# Patient Record
Sex: Female | Born: 1995 | Race: Black or African American | Hispanic: No | Marital: Married | Smoking: Current every day smoker
Health system: Southern US, Community
[De-identification: ages and names within clinical notes are randomized; demographics above are authoritative.]

## PROBLEM LIST (undated history)

## (undated) DIAGNOSIS — I864 Gastric varices: Secondary | ICD-10-CM

## (undated) DIAGNOSIS — R519 Headache, unspecified: Secondary | ICD-10-CM

## (undated) DIAGNOSIS — I10 Essential (primary) hypertension: Secondary | ICD-10-CM

## (undated) DIAGNOSIS — F32A Depression, unspecified: Secondary | ICD-10-CM

## (undated) DIAGNOSIS — F329 Major depressive disorder, single episode, unspecified: Secondary | ICD-10-CM

## (undated) DIAGNOSIS — Q633 Hyperplastic and giant kidney: Secondary | ICD-10-CM

## (undated) DIAGNOSIS — R51 Headache: Secondary | ICD-10-CM

## (undated) DIAGNOSIS — K754 Autoimmune hepatitis: Secondary | ICD-10-CM

## (undated) HISTORY — PX: ESOPHAGOGASTRODUODENOSCOPY ENDOSCOPY: SHX5814

---
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
## 2016-02-20 ENCOUNTER — Encounter (HOSPITAL_COMMUNITY): Payer: Self-pay | Admitting: Emergency Medicine

## 2016-02-20 ENCOUNTER — Emergency Department (HOSPITAL_COMMUNITY)
Admission: EM | Admit: 2016-02-20 | Discharge: 2016-02-21 | Disposition: A | Discharge status: Home / Self Care | Payer: Medicaid - Out of State | Attending: Emergency Medicine | Admitting: Emergency Medicine

## 2016-02-20 ENCOUNTER — Emergency Department (HOSPITAL_COMMUNITY): Payer: Medicaid - Out of State

## 2016-02-20 DIAGNOSIS — Z91040 Latex allergy status: Secondary | ICD-10-CM | POA: Diagnosis not present

## 2016-02-20 DIAGNOSIS — Z79899 Other long term (current) drug therapy: Secondary | ICD-10-CM | POA: Insufficient documentation

## 2016-02-20 DIAGNOSIS — F17200 Nicotine dependence, unspecified, uncomplicated: Secondary | ICD-10-CM | POA: Insufficient documentation

## 2016-02-20 DIAGNOSIS — R109 Unspecified abdominal pain: Secondary | ICD-10-CM | POA: Diagnosis not present

## 2016-02-20 DIAGNOSIS — I10 Essential (primary) hypertension: Secondary | ICD-10-CM | POA: Insufficient documentation

## 2016-02-20 HISTORY — DX: Autoimmune hepatitis: K75.4

## 2016-02-20 HISTORY — DX: Gastric varices: I86.4

## 2016-02-20 HISTORY — DX: Depression, unspecified: F32.A

## 2016-02-20 HISTORY — DX: Headache, unspecified: R51.9

## 2016-02-20 HISTORY — DX: Hyperplastic and giant kidney: Q63.3

## 2016-02-20 HISTORY — DX: Essential (primary) hypertension: I10

## 2016-02-20 HISTORY — DX: Major depressive disorder, single episode, unspecified: F32.9

## 2016-02-20 HISTORY — DX: Headache: R51

## 2016-02-20 LAB — COMPREHENSIVE METABOLIC PANEL
ALK PHOS: 70 U/L (ref 38–126)
ALT: 27 U/L (ref 14–54)
ANION GAP: 8 (ref 5–15)
AST: 49 U/L — AB (ref 15–41)
Albumin: 4.6 g/dL (ref 3.5–5.0)
BILIRUBIN TOTAL: 1.9 mg/dL — AB (ref 0.3–1.2)
BUN: 9 mg/dL (ref 6–20)
CALCIUM: 9.3 mg/dL (ref 8.9–10.3)
CO2: 24 mmol/L (ref 22–32)
Chloride: 106 mmol/L (ref 101–111)
Creatinine, Ser: 0.87 mg/dL (ref 0.44–1.00)
GFR calc Af Amer: >60 mL/min (ref >60)
GLUCOSE: 72 mg/dL (ref 65–99)
POTASSIUM: 3.8 mmol/L (ref 3.5–5.1)
Sodium: 138 mmol/L (ref 135–145)
TOTAL PROTEIN: 7.4 g/dL (ref 6.5–8.1)

## 2016-02-20 LAB — CBC
HEMATOCRIT: 44.7 % (ref 36.0–46.0)
Hemoglobin: 14.9 g/dL (ref 12.0–15.0)
MCH: 29.6 pg (ref 26.0–34.0)
MCHC: 33.3 g/dL (ref 30.0–36.0)
MCV: 88.9 fL (ref 78.0–100.0)
Platelets: 95 10*3/uL — ABNORMAL LOW (ref 150–400)
RBC: 5.03 MIL/uL (ref 3.87–5.11)
RDW: 14.5 % (ref 11.5–15.5)
WBC: 7.8 10*3/uL (ref 4.0–10.5)

## 2016-02-20 LAB — I-STAT BETA HCG BLOOD, ED (MC, WL, AP ONLY)

## 2016-02-20 LAB — LIPASE, BLOOD: Lipase: 39 U/L (ref 11–51)

## 2016-02-20 MED ORDER — KETOROLAC TROMETHAMINE 30 MG/ML IJ SOLN
30.0000 mg | Freq: Once | INTRAMUSCULAR | Status: AC
  Administered 2016-02-20: 30 mg via INTRAVENOUS
  Filled 2016-02-20: qty 1

## 2016-02-20 MED ORDER — SODIUM CHLORIDE 0.9 % IV BOLUS (SEPSIS)
1000.0000 mL | Freq: Once | INTRAVENOUS | Status: AC
  Administered 2016-02-20: 1000 mL via INTRAVENOUS

## 2016-02-20 MED ORDER — MORPHINE SULFATE (PF) 4 MG/ML IV SOLN
4.0000 mg | Freq: Once | INTRAVENOUS | Status: AC
  Administered 2016-02-20: 4 mg via INTRAVENOUS
  Filled 2016-02-20: qty 1

## 2016-02-20 MED ORDER — ONDANSETRON HCL 4 MG/2ML IJ SOLN
4.0000 mg | Freq: Once | INTRAMUSCULAR | Status: AC
  Administered 2016-02-20: 4 mg via INTRAVENOUS
  Filled 2016-02-20: qty 2

## 2016-02-20 NOTE — ED Notes (Addendum)
Pt crying in the treatment room. Stated that she has pain on both sides of her abdomen. Denies urinary symptoms. Pt informed that she needs to give a urine sample. No cough noted at this time.
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
## 2016-02-20 NOTE — ED Notes (Signed)
Patient transported to CT 

## 2016-02-20 NOTE — ED Triage Notes (Addendum)
Patient reports right abdominal pain radiating to right lower back onset 3 days ago , denies emesis or diarrhea , no fever or chills . Pt. added productive cough today .

## 2016-02-21 LAB — URINALYSIS, ROUTINE W REFLEX MICROSCOPIC
BILIRUBIN URINE: NEGATIVE
GLUCOSE, UA: NEGATIVE mg/dL
HGB URINE DIPSTICK: NEGATIVE
Ketones, ur: NEGATIVE mg/dL
Nitrite: NEGATIVE
PROTEIN: NEGATIVE mg/dL
Specific Gravity, Urine: 1.025 (ref 1.005–1.030)
pH: 6 (ref 5.0–8.0)

## 2016-02-21 MED ORDER — ONDANSETRON 4 MG PO TBDP: 4.0000 mg | ORAL_TABLET | Freq: Three times a day (TID) | ORAL | 0 refills | Status: AC | PRN

## 2016-02-21 NOTE — Discharge Instructions (Signed)
Take 4 over the counter ibuprofen tablets 3 times a day or 2 over-the-counter naproxen tablets twice a day for pain. Also take tylenol 1000mg(2 extra strength) four times a day.    

## 2018-12-07 IMAGING — CT CT RENAL STONE PROTOCOL
2 of 4 series · 12 of 46 positions shown, 14 images · non-contrast
Comparison: 04/30/2005

CLINICAL DATA: Right abdominal pain radiating to the back

EXAM:
CT ABDOMEN AND PELVIS WITHOUT CONTRAST
TECHNIQUE: Multidetector CT imaging of the abdomen and pelvis was performed
following the standard protocol without IV contrast.

[Series 201: stone study, idose (2) · axial · 0.64mm/px · z∈[+275,+640]mm · 9 of 89 slices shown, 11 images]
[im 8/89  soft-tissue]
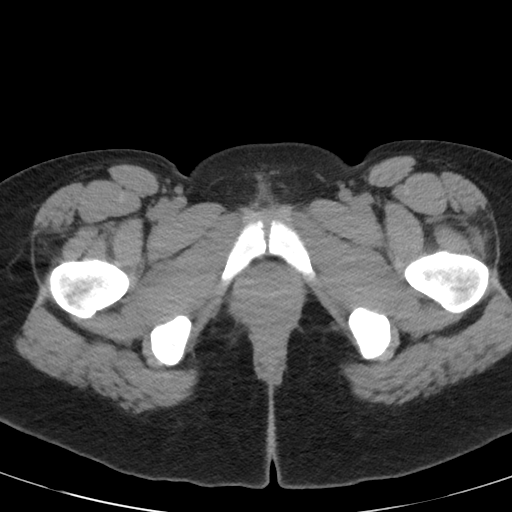
[im 8/89  bone]
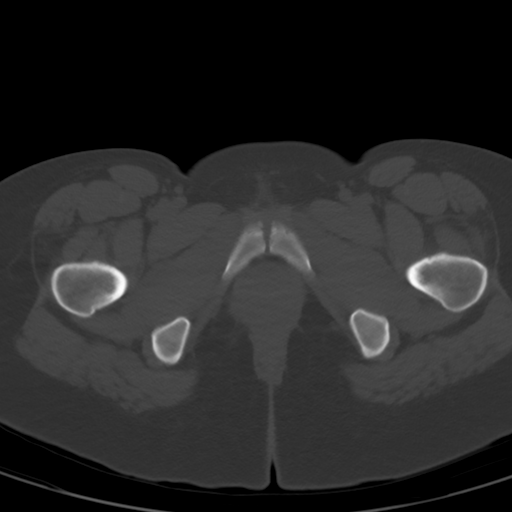
[im 15/89  soft-tissue]
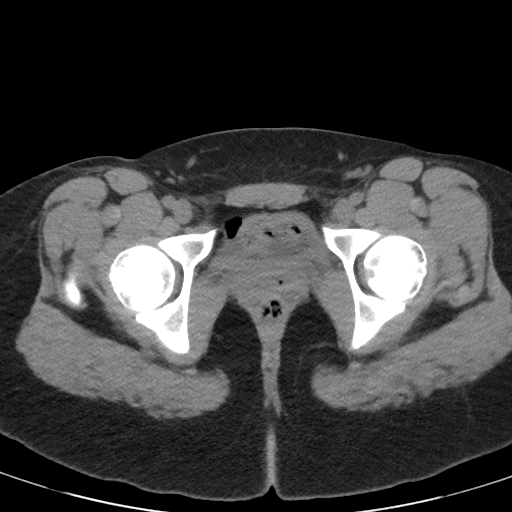
[im 26/89  soft-tissue]
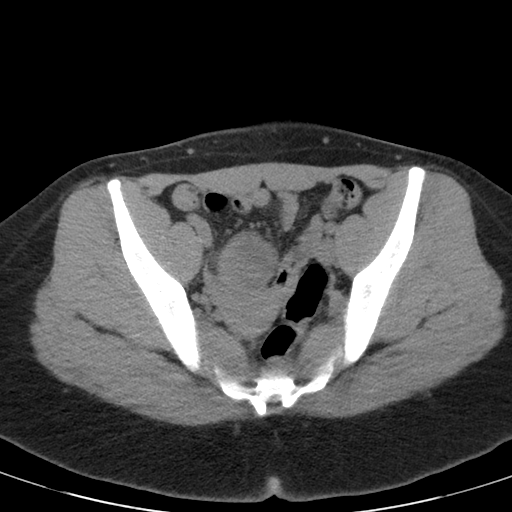
[im 34/89  soft-tissue]
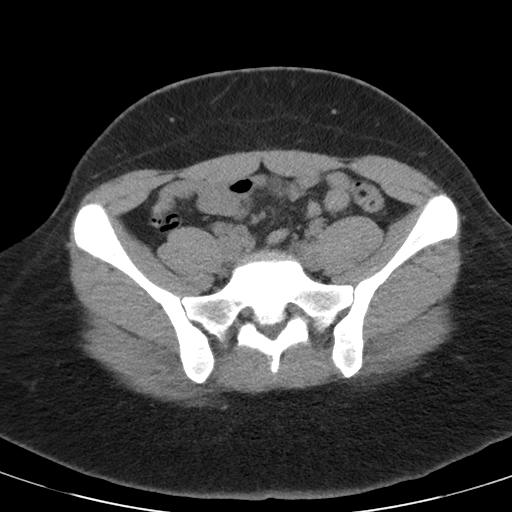
[im 45/89  soft-tissue]
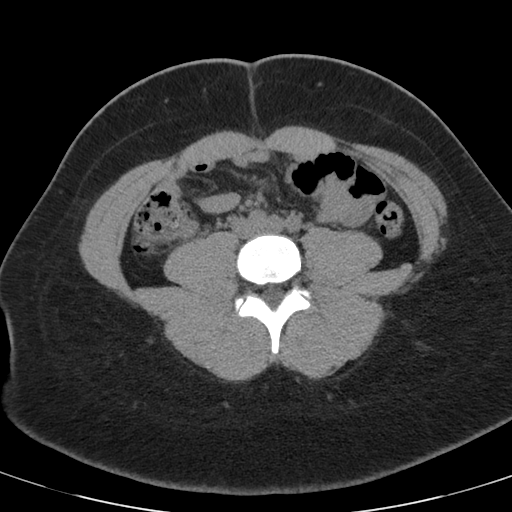
[im 56/89  soft-tissue]
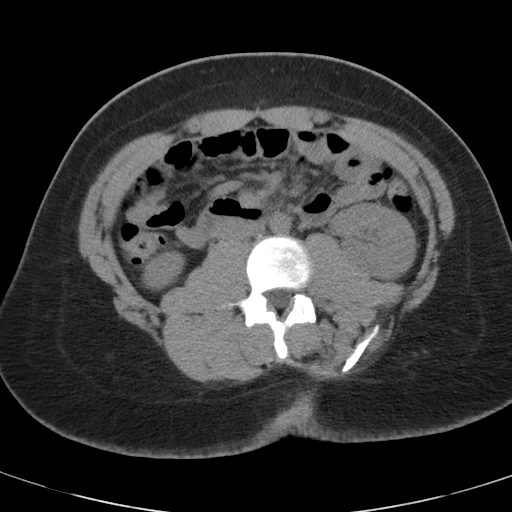
[im 63/89  soft-tissue]
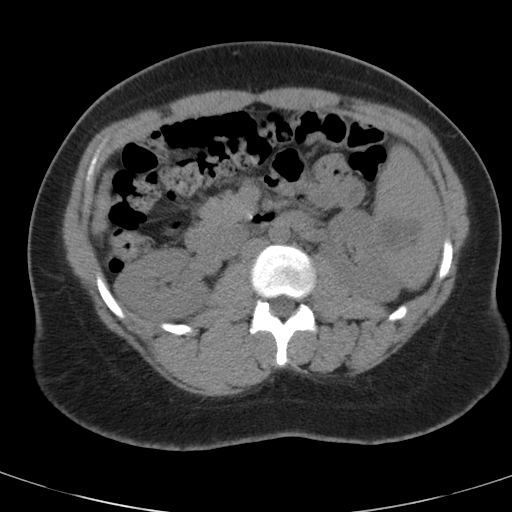
[im 74/89  soft-tissue]
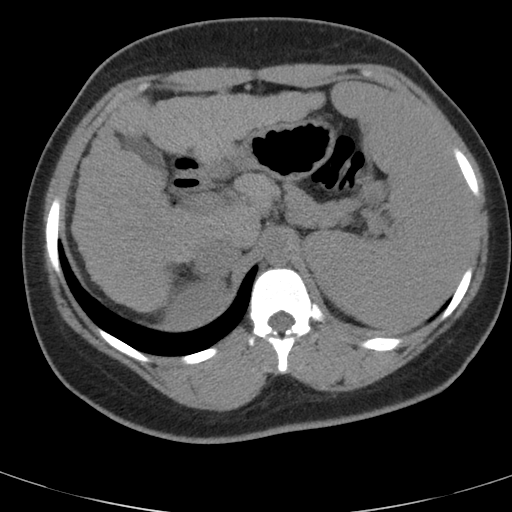
[im 81/89  soft-tissue]
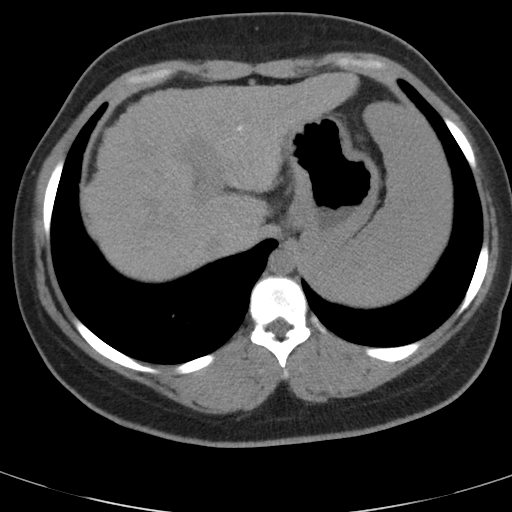
[im 81/89  bone]
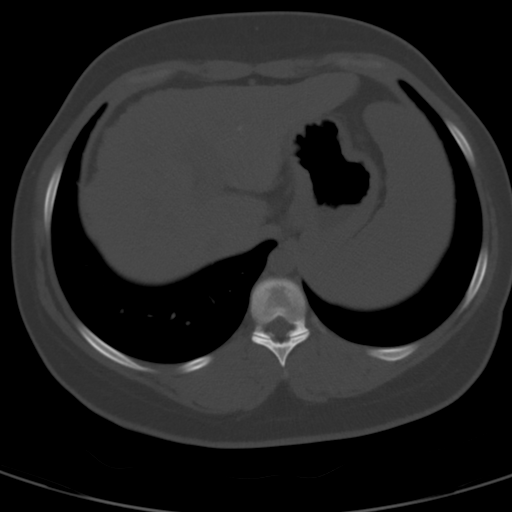

[Series 203: coronals, idose (2) · coronal · 0.45mm/px · 3 of 130 slices shown]
[im 44/130  soft-tissue]
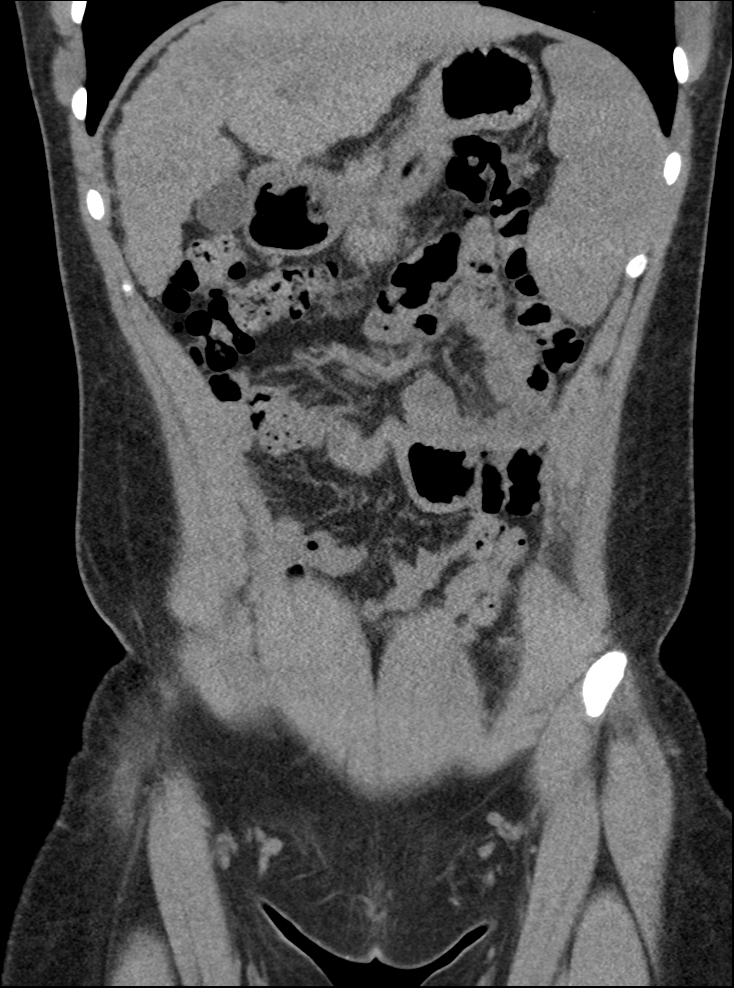
[im 58/130  soft-tissue]
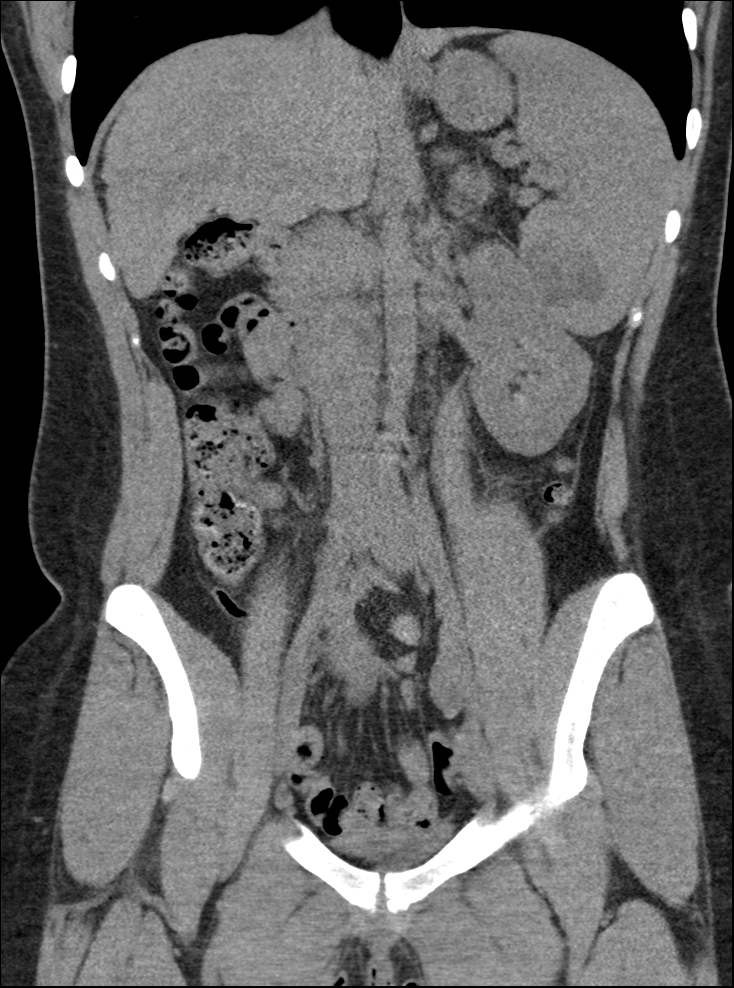
[im 72/130  soft-tissue]
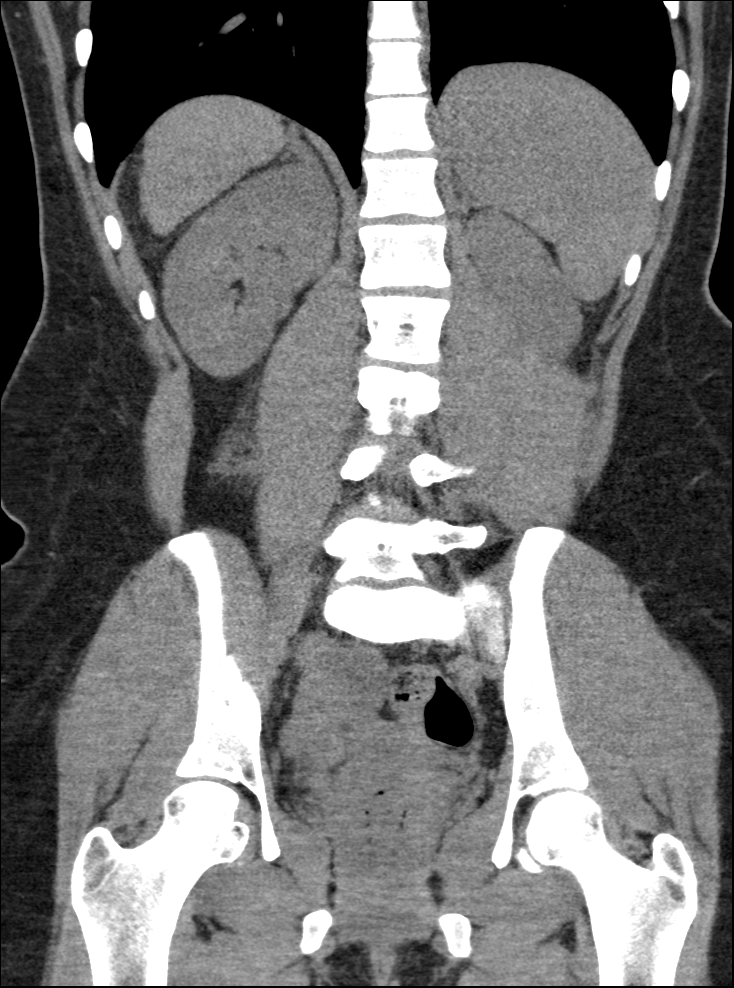

[12 of 46 positions shown; findings below may reference images not displayed]

FINDINGS: Lower chest: Visualized lung bases are clear and without acute
infiltrate, consolidation, or pleural effusion.

Hepatobiliary: Diffuse nodular contour of the liver suggests
cirrhosis. Small dystrophic calcifications or granuloma are present
within the liver. No gross focal hepatic abnormality. No calcified
gallstones. No biliary dilatation.

Pancreas: Unremarkable. No pancreatic ductal dilatation or
surrounding inflammatory changes.

Spleen: Markedly enlarged, measuring at least 16 cm. 3.4 by 2.7 cm
low-density lesion within the inferior spleen.

Adrenals/Urinary Tract: Adrenal glands are within normal limits. No
hydronephrosis of either kidney. Small stone present within the mid
right kidney measuring up to 3 mm. No ureteral calculi. Bladder is
empty.

Stomach/Bowel: Stomach is nonenlarged. There is no dilated large or
small bowel. The appendix is visualized and is normal.

Vascular/Lymphatic: Aorta at non aneurysmal. No significantly
enlarged lymph nodes.

Reproductive: Uterus grossly unremarkable. 4.7 cm hypodense right
adnexal mass with possible fluid level on the sagittal images.

Other: No free air or free fluid.

Musculoskeletal: No acute osseous abnormality.
IMPRESSION: 1. Nodular liver contour, compatible with cirrhosis, patient
reportedly has history of autoimmune hepatitis. Markedly enlargement
of the spleen.
2. Nonobstructing stone within the right kidney. Negative for
ureteral stone. Negative for appendicitis.
3. 3.4 cm low-attenuation lesion within the spleen. Nonemergent MRI
suggested for further evaluation.
4. 4.7 cm hypodense right adnexal mass with possible fluid level ?
Hemorrhagic cyst. Consider correlation with ultrasound given history
of right-sided pain.
